# Patient Record
Sex: Male | Born: 2005 | Race: White | Hispanic: No | Marital: Single | Smoking: Never smoker
Health system: Southern US, Community
[De-identification: ages and names within clinical notes are randomized; demographics above are authoritative.]

## PROBLEM LIST (undated history)

## (undated) DIAGNOSIS — K219 Gastro-esophageal reflux disease without esophagitis: Secondary | ICD-10-CM

## (undated) DIAGNOSIS — J45909 Unspecified asthma, uncomplicated: Secondary | ICD-10-CM

## (undated) HISTORY — DX: Gastro-esophageal reflux disease without esophagitis: K21.9

## (undated) HISTORY — DX: Unspecified asthma, uncomplicated: J45.909

---
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
## 2023-01-21 ENCOUNTER — Telehealth: Payer: Self-pay | Admitting: Internal Medicine

## 2023-01-21 NOTE — Telephone Encounter (Signed)
Spoke with patients mother about continuing care with the provider- mother states that she would love for her son to continue to be seen but he is away at college now and cannot schedule until break
